# Patient Record
Sex: Male | Born: 1960 | Race: White | Hispanic: No | Marital: Married | Smoking: Never smoker
Health system: Southern US, Academic
[De-identification: ages and names within clinical notes are randomized; demographics above are authoritative.]

## PROBLEM LIST (undated history)

## (undated) DIAGNOSIS — K5730 Diverticulosis of large intestine without perforation or abscess without bleeding: Secondary | ICD-10-CM

## (undated) HISTORY — PX: HX TONSILLECTOMY: SHX27

## (undated) HISTORY — DX: Diverticulosis of large intestine without perforation or abscess without bleeding: K57.30

## (undated) HISTORY — PX: HX HEMORRHOIDECTOMY: SHX153

## (undated) HISTORY — PX: HX COLON SURGERY (ANY): 2100001403

---

## 1998-09-10 ENCOUNTER — Ambulatory Visit (HOSPITAL_COMMUNITY): Payer: Self-pay | Admitting: Orthopaedic Surgery

## 2016-12-29 ENCOUNTER — Ambulatory Visit (HOSPITAL_COMMUNITY): Payer: Self-pay | Admitting: PSYCHIATRY AND NEUROLOGY-NEUROLOGY

## 2022-01-04 ENCOUNTER — Other Ambulatory Visit (INDEPENDENT_AMBULATORY_CARE_PROVIDER_SITE_OTHER): Payer: BC Managed Care – PPO | Admitting: OTOLARYNGOLOGY

## 2022-01-04 DIAGNOSIS — J301 Allergic rhinitis due to pollen: Secondary | ICD-10-CM

## 2022-01-04 DIAGNOSIS — J3089 Other allergic rhinitis: Secondary | ICD-10-CM

## 2022-01-04 DIAGNOSIS — J3081 Allergic rhinitis due to animal (cat) (dog) hair and dander: Secondary | ICD-10-CM

## 2022-01-04 NOTE — Nursing Note (Signed)
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)   [place] Grass   Initial Endpoint - [place] Grass 5   Dilution of Antigen 1   Volume of Antigen 0.28ml   Timothy   Initial Endpoint - Timothy 6   Dilution of Antigen 2   Volume of Antigen 0.51ml   Bahia   Initial Endpoint - Bahia 5   Dilution of Antigen 1   Volume of Antigen 0.72ml   Ragweed   Initial Endpoint - Ragweed 6   Dilution of Antigen 2   Volume of Antigen 0.52ml   Mugwort   Initial Endpoint - Mugwort 5   Dilution of Antigen 1   Volume of Antigen 0.69ml   A   Concentrates A 1   10% Glycerine Diluent A 4.5   Total Volume A 5.5   B   Preparation Date 01/04/22   Expiration Date 04/19/22   Immunotherapy Status No escalation   Diagnosis 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);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);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)   Candida   Initial Endpoint - Candida 4   Dilution of Antigen 1   Volume of Antigen 0.43ml   Epidermophyton   Initial Endpoint - Epidermophyton 4   Dilution of Antigen 1   Volume of Antigen 0.17ml   Bipolaris Sorokiniana   Initial Endpoint - Bipolaris Sorokiniana 3   Dilution of Antigen Concentrate   Volume of Antigen 0.20ml   Penicillium   Initial Endpoint - Penicillium 2   Dilution of Antigen Concentrate   Volume of Antigen 0.61ml   Corn Smut   Initial Endpoint - Corn Smut 2   Dilution of Antigen Concentrate   Volume of Antigen 0.37ml   Cat   Initial Endpoint - Cat 2   Dilution of Antigen Concentrate   Volume of Antigen 0.21ml   D. Farinae Dust Mite   Initial Endpoint - D. Farinae Dust Mite 4   Dilution of Antigen Concentrate   Volume of Antigen 0.107ml   D. Pteronyssinus   Initial Endpoint - D. Pteronyssinus 4   Dilution of Antigen Concentrate   Volume of Antigen 0.10ml   B    Concentrates B 1.6   Saline Diluent B 3.9   Total Volume B 5.5     Levert Feinstein, LPN

## 2022-01-18 ENCOUNTER — Ambulatory Visit (INDEPENDENT_AMBULATORY_CARE_PROVIDER_SITE_OTHER): Payer: Self-pay

## 2022-01-18 ENCOUNTER — Ambulatory Visit (INDEPENDENT_AMBULATORY_CARE_PROVIDER_SITE_OTHER): Payer: BC Managed Care – PPO

## 2022-01-18 ENCOUNTER — Other Ambulatory Visit: Payer: Self-pay

## 2022-01-18 DIAGNOSIS — J309 Allergic rhinitis, unspecified: Secondary | ICD-10-CM

## 2022-01-18 NOTE — Nursing Note (Signed)
01/18/22 1300   Vial A   Set 2   Skin test/Wheal Size 0.68ml, 80mm   Injection Laterality Right   Injection Site Upper Arm   Dose  0.05   Time Observed 30 Minutes   Injection Site Reaction Negative   Vial B   Set 1   Skin test/Wheal Size 0.75ml, 71mm   Injection Laterality Left   Injection Site Upper Arm   Dose  0.05   Time Observed 30 Minutes   Injection Site Reaction Negative     Gabriel Carina, LPN  [id number], [id number]    I have reviewed the above allergy vial safety test, give injection.  Emiliano Dyer, FNP-BC

## 2022-04-07 IMAGING — MR MRI BRAIN W/O CONTRAST
8 of 10 series · 34 of 48 positions shown · IV contrast (gadolinium)
Comparison: Brain MRI dated 03/16/2021 and 03/23/2020.

﻿EXAM:  MRI BRAIN W/O CONTRAST
INDICATION: History of multiple sclerosis, follow-up exam.
TECHNIQUE: Multiplanar, multisequential MRI of the brain was performed without gadolinium contrast.

[Series 5: DWI · axial · 5.0mm · 1.35mm/px · z∈[-69,+57]mm · 11 of 88 slices shown (1 of 3)]
[im 1/88]
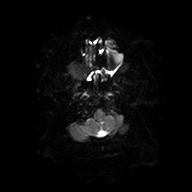
[im 9/88]
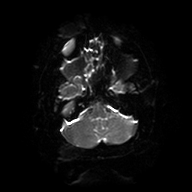
[im 18/88]
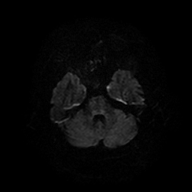
[im 27/88]
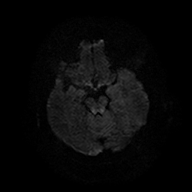
[im 35/88]
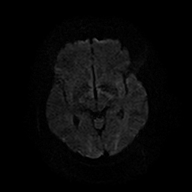
[im 44/88]
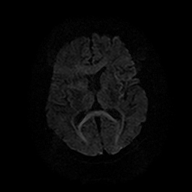
[im 53/88]
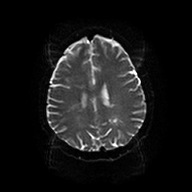
[im 61/88]
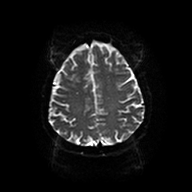
[im 70/88]
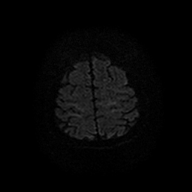
[im 79/88]
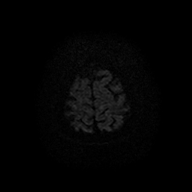
[im 88/88]
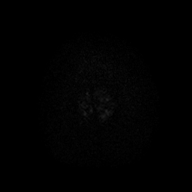

[Series 6: DWI · axial · 5.0mm · 1.35mm/px · z∈[-69,+57]mm · 2 of 22 slices shown (2 of 3)]
[im 1/22]
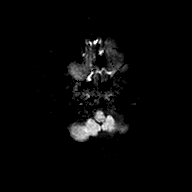
[im 22/22]
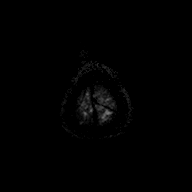

[Series 7: DWI · axial · 5.0mm · 1.35mm/px · z∈[-69,+57]mm · 2 of 18 slices shown (3 of 3)]
[im 1/18]
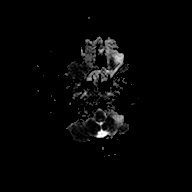
[im 18/18]
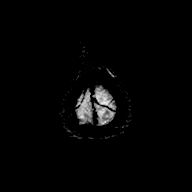

[Series 8: FLAIR · sagittal · 5.0mm · 0.75mm/px · 4 of 28 slices shown (1 of 2)]
[im 1/28]
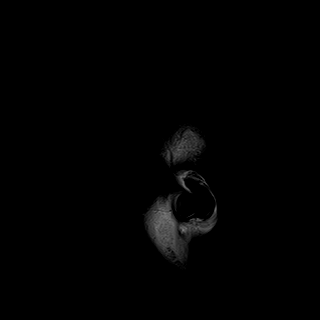
[im 10/28]
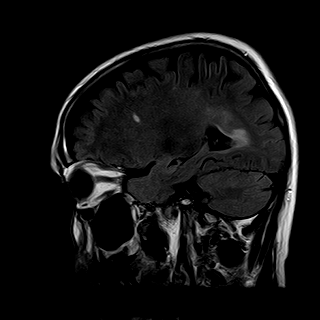
[im 19/28]
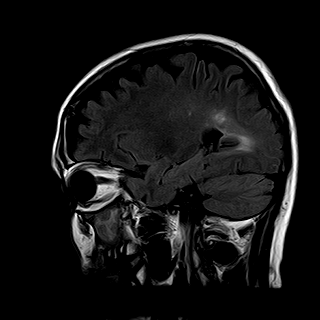
[im 28/28]
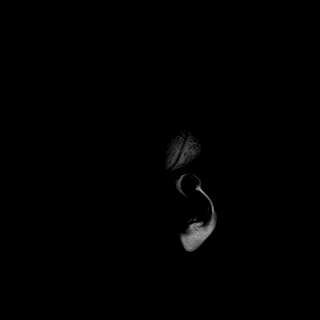

[Series 9: PD · axial · 4.0mm · 0.43mm/px · 1 of 36 slices shown]
[im 1/36]
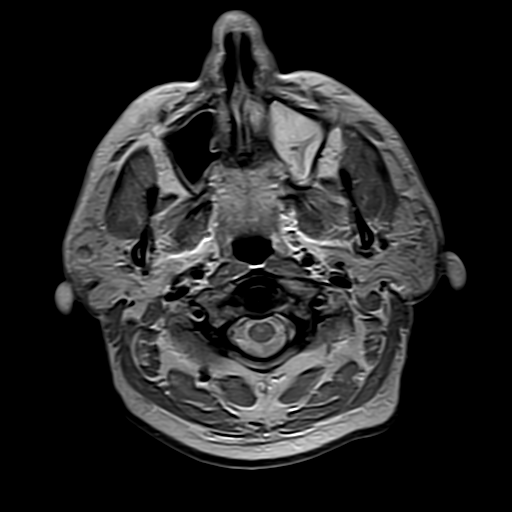

[Series 11: FLAIR · axial · 4.0mm · 0.43mm/px · z∈[-92,+62]mm · 5 of 36 slices shown (2 of 2)]
[im 1/36]
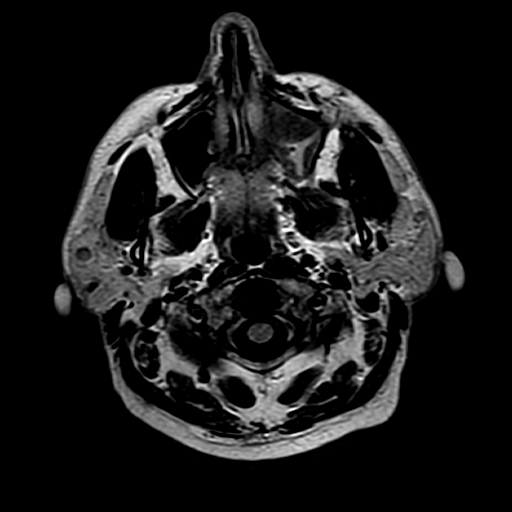
[im 9/36]
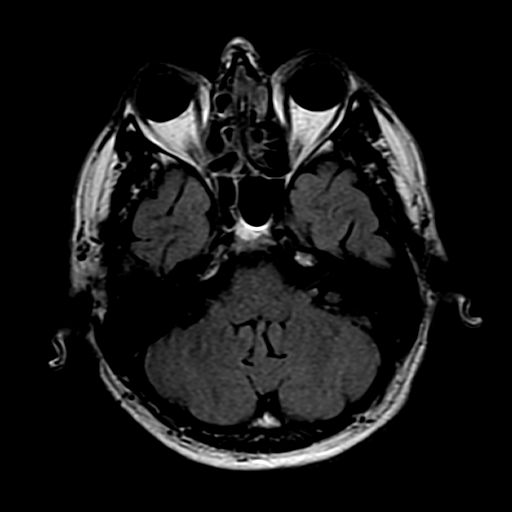
[im 18/36]
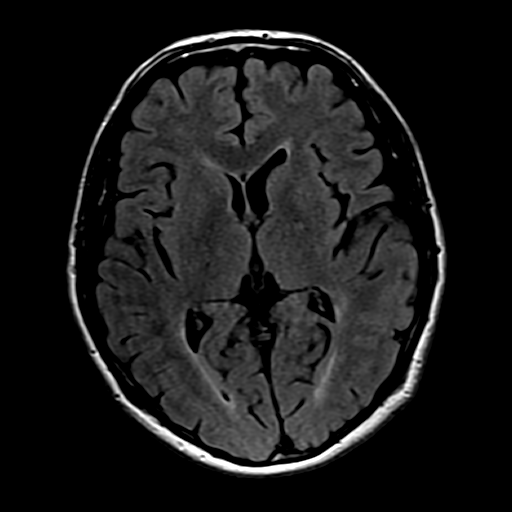
[im 27/36]
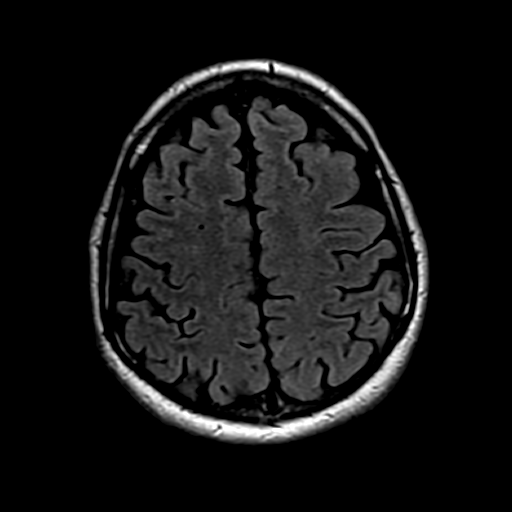
[im 36/36]
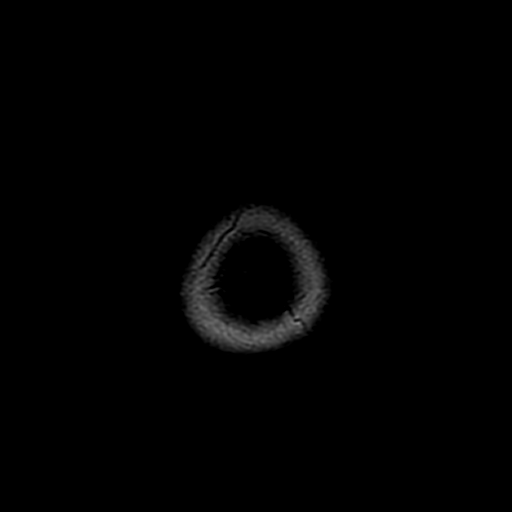

[Series 13: T1 · axial · 4.0mm · 0.43mm/px · z∈[-92,+62]mm · 5 of 36 slices shown]
[im 1/36]
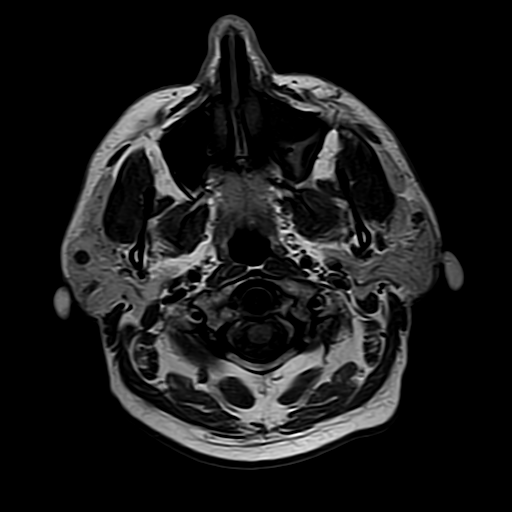
[im 9/36]
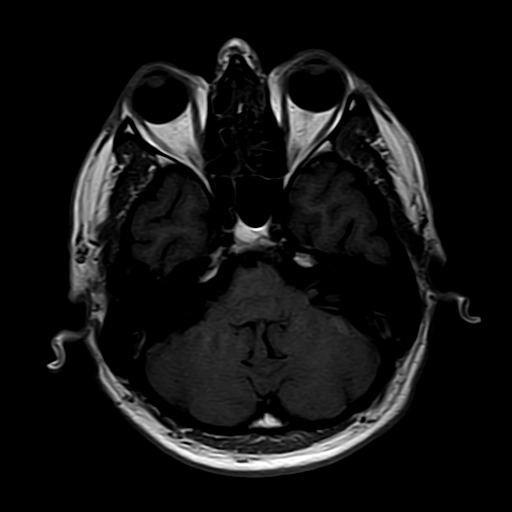
[im 18/36]
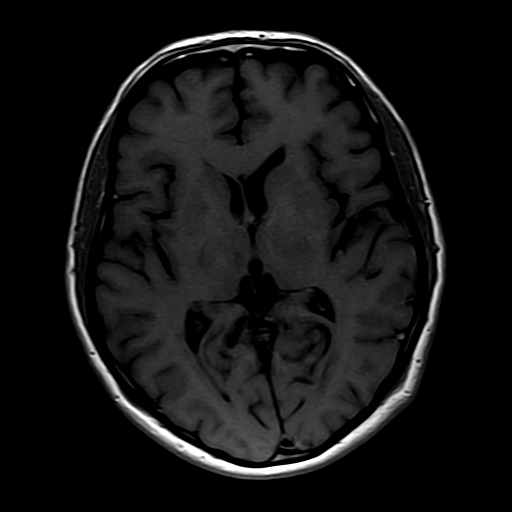
[im 27/36]
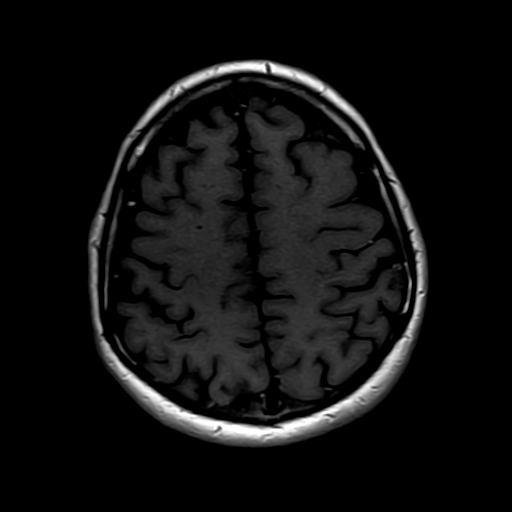
[im 36/36]
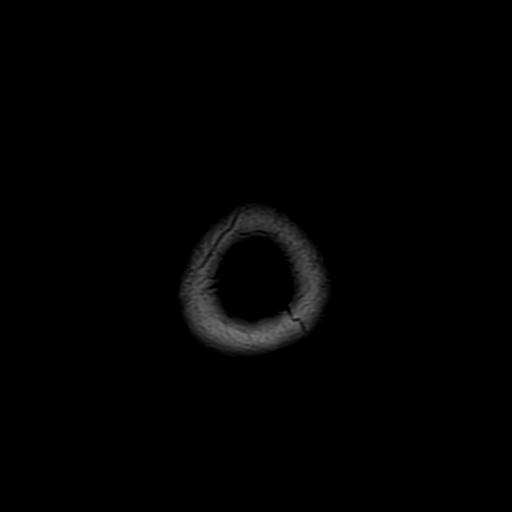

[Series 14: T2 · coronal · 5.2mm · 0.43mm/px · 4 of 30 slices shown]
[im 1/30]
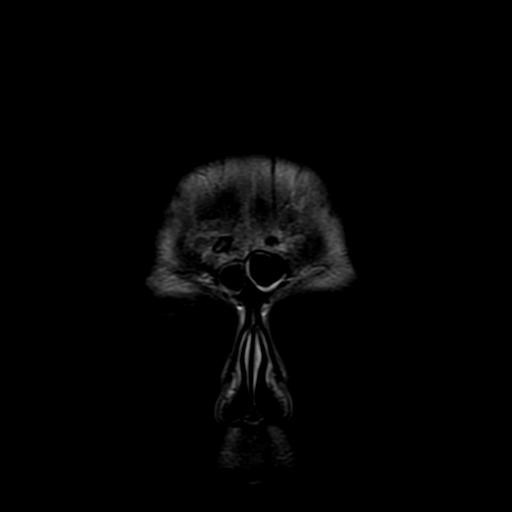
[im 10/30]
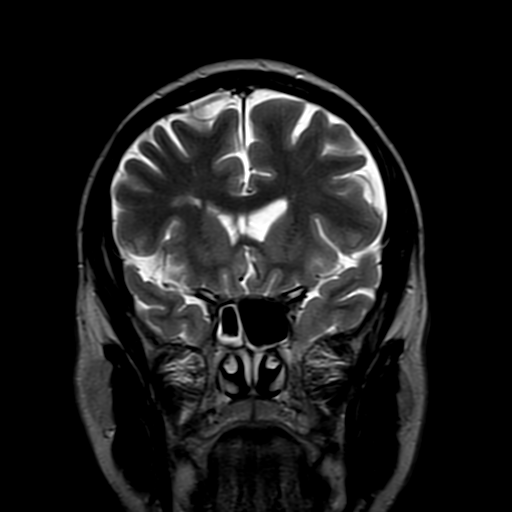
[im 20/30]
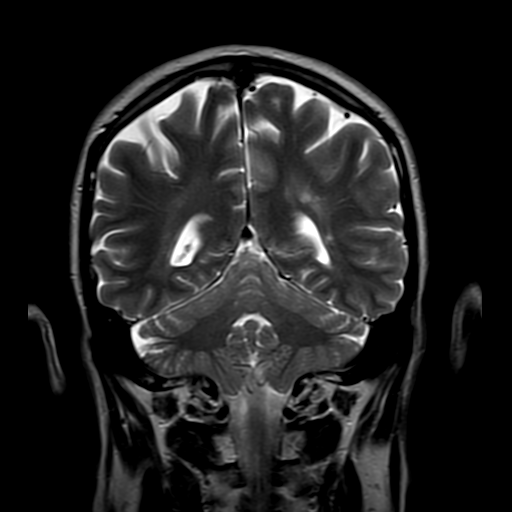
[im 30/30]
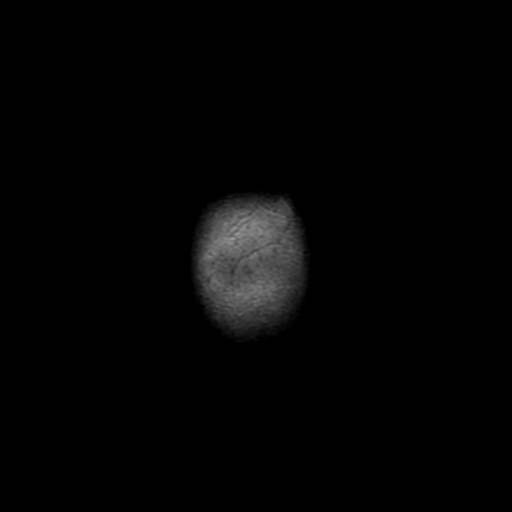

[34 of 48 positions shown; findings below may reference images not displayed]

FINDINGS: Ventricular and sulcal size is normal for the patient's age.  Approximately 4-5 foci of abnormal T2/FLAIR signal hyperintensity within each cerebral hemisphere are again identified. There are no new lesions.  There is no mass effect, midline shift or intracranial hemorrhage. There is no evidence of acute infarction or prior microhemorrhages. Skull base flow voids and basal cisterns are patent. Sagittal survey of midline structures is unremarkable. There are no extra-axial fluid collections.  There is complete opacification of the left maxillary sinus. Significant mucoperiosteal thickening of the remaining paranasal sinuses is also noted. Mastoid air cells and orbital contents are unremarkable.
IMPRESSION: Stable disease.  No acute intracranial abnormality.

## 2022-04-10 ENCOUNTER — Other Ambulatory Visit: Payer: Self-pay

## 2022-05-03 ENCOUNTER — Other Ambulatory Visit (INDEPENDENT_AMBULATORY_CARE_PROVIDER_SITE_OTHER): Payer: BC Managed Care – PPO | Admitting: OTOLARYNGOLOGY

## 2022-05-03 DIAGNOSIS — J301 Allergic rhinitis due to pollen: Secondary | ICD-10-CM

## 2022-05-03 DIAGNOSIS — J3089 Other allergic rhinitis: Secondary | ICD-10-CM

## 2022-05-03 NOTE — Nursing Note (Signed)
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)   [place] Grass   Initial Endpoint - [place] Grass 5   Dilution of Antigen 1   Volume of Antigen 0.61ml   Timothy   Initial Endpoint - Timothy 6   Dilution of Antigen 2   Volume of Antigen 0.54ml   Bahia   Initial Endpoint - Bahia 5   Dilution of Antigen 1   Volume of Antigen 0.40ml   Ragweed   Initial Endpoint - Ragweed 6   Dilution of Antigen 2   Volume of Antigen 0.85ml   Mugwort   Initial Endpoint - Mugwort 5   Dilution of Antigen 1   Volume of Antigen 0.27ml   A   Concentrates A 1   10% Glycerine Diluent A 4.5   Total Volume A 5.5   B   Preparation Date 05/03/22   Expiration Date 08/17/22   Immunotherapy Status No escalation   Diagnosis 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);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);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)   Candida   Initial Endpoint - Candida 4   Dilution of Antigen 1   Volume of Antigen 0.62ml   Epidermophyton   Initial Endpoint - Epidermophyton 4   Dilution of Antigen 1   Volume of Antigen 0.81ml   Bipolaris Sorokiniana   Initial Endpoint - Bipolaris [place] 3   Dilution of Antigen Concentrate   Volume of Antigen 0.50ml   Penicillium   Initial Endpoint - Penicillium 2   Dilution of Antigen Concentrate   Volume of Antigen 0.41ml   Corn Smut   Initial Endpoint - Corn Smut 2   Dilution of Antigen Concentrate   Volume of Antigen 0.60ml   Cat   Initial Endpoint - Cat 2   Dilution of Antigen Concentrate   Volume of Antigen 0.84ml   D. Farinae Dust Mite   Initial Endpoint - D. Farinae Dust Mite 4   Dilution of Antigen Concentrate   Volume of Antigen 0.86ml   D. Pteronyssinus   Initial Endpoint - D. Pteronyssinus 4   Dilution of Antigen Concentrate   Volume of Antigen 0.62ml   B    Concentrates B 1.6   Saline Diluent B 3.9   Total Volume B 5.5     Jasmine Awe, LPN  I have reviewed the above allergy test results which were positive, and will pursue treatment accordingly.  Lonia Farber, DO

## 2022-05-17 ENCOUNTER — Ambulatory Visit (INDEPENDENT_AMBULATORY_CARE_PROVIDER_SITE_OTHER): Payer: BC Managed Care – PPO

## 2022-05-17 ENCOUNTER — Other Ambulatory Visit: Payer: Self-pay

## 2022-05-17 DIAGNOSIS — J309 Allergic rhinitis, unspecified: Secondary | ICD-10-CM

## 2022-05-17 NOTE — Nursing Note (Signed)
05/17/22 1300   Vial A   Set 2   Skin test/Wheal Size 0.31ml/ 57mm   Injection Laterality Right   Injection Site Upper Arm   Dose  0.25   Time Observed 30 Minutes   Vial B   Set 1   Skin test/Wheal Size 0.70ml/ 63mm   Injection Laterality Left   Injection Site Upper Arm   Dose  0.25   Time Observed 30 Minutes     Merlinda Frederick, LPN  I have reviewed the above allergy vial safety test, give injection.  Elnora Morrison, FNP-BC

## 2022-05-17 NOTE — Nursing Note (Signed)
05/17/22 1300   Vial A   Set 2   Skin test/Wheal Size 0.66ml/ 23mm   Injection Laterality Right   Injection Site Upper Arm   Dose  0.25   Time Observed 30 Minutes   Injection Site Reaction Negative   Vial B   Set 1   Skin test/Wheal Size 0.13ml/ 62mm   Injection Laterality Left   Injection Site Upper Arm   Dose  0.25   Time Observed 30 Minutes   Injection Site Reaction Negative     Merlinda Frederick, LPN

## 2022-05-26 ENCOUNTER — Encounter (INDEPENDENT_AMBULATORY_CARE_PROVIDER_SITE_OTHER): Payer: Self-pay | Admitting: Surgery

## 2022-05-30 ENCOUNTER — Ambulatory Visit (INDEPENDENT_AMBULATORY_CARE_PROVIDER_SITE_OTHER): Payer: BC Managed Care – PPO | Admitting: Surgery

## 2022-05-30 ENCOUNTER — Other Ambulatory Visit: Payer: Self-pay

## 2022-05-30 ENCOUNTER — Encounter (INDEPENDENT_AMBULATORY_CARE_PROVIDER_SITE_OTHER): Payer: Self-pay | Admitting: Surgery

## 2022-05-30 VITALS — BP 140/75 | HR 62 | Temp 96.8°F | Resp 18 | Ht 66.0 in | Wt 164.0 lb

## 2022-05-30 DIAGNOSIS — Z1211 Encounter for screening for malignant neoplasm of colon: Secondary | ICD-10-CM

## 2022-05-30 MED ORDER — SUTAB 1.479-0.188-0.225 GRAM TABLET
ORAL_TABLET | ORAL | 0 refills | Status: DC
Start: 2022-05-30 — End: 2022-09-13

## 2022-06-01 NOTE — Progress Notes (Signed)
GENERAL SURGERY, [facility]  [address]  [place] [id number]    History and Physical     Name: Clarence Nguyen MRN:  [id number]   Date: 05/30/2022 Age: 61 y.o.            Reason for Visit: Colonoscopy    History of Present Illness  [name] presents today for screening colonoscopy    Negative diabetes, blood thinner   Positive family history colon cancer in grandmother      Review of the result(s) of each unique test:  Patient underwent diagnostic testing ( none ) prior to this dates visit.  I have personally reviewed the results and that serves as a component of the medical decision making for this encounter       Review of prior external note(s) from each unique source:  Patients referral to this office including a recent assessment by the referring provider.  This was reviewed by me for this unique office visit for the indication and intent of the referral as well as any pertinent medical or surgical history relevant to the patients independent evaluation by me today.      Patient History  Past Medical History:   Diagnosis Date    Diverticulosis of colon          Past Surgical History:   Procedure Laterality Date    HX COLON SURGERY (ANY)      HX HEMORRHOIDECTOMY      HX TONSILLECTOMY           Current Outpatient Medications   Medication Sig    atorvastatin (LIPITOR) 10 mg Oral Tablet     famotidine (PEPCID) 40 mg Oral Tablet     fluticasone propionate (XHANCE) 93 mcg/actuation Nasal Aerosol Breath Activated Spray 1 spray twice a day by intranasal route.    glatiramer (COPAXONE) 40 mg/mL Subcutaneous Syringe Inject 1 mL (40 mg total) under the skin    sod sulf-pot chloride-mag sulf (SUTAB) 1.479-0.188- 0.225 gram Oral Tablet Take 12 pills at 10:00am to 10:30am Take 12 pills at 6:00pm to 6:30pm. Drink plenty of water     No Known Allergies  Family Medical History:    None         Social History     Tobacco Use    Smoking status: Never    Smokeless tobacco: Never   Vaping  Use    Vaping Use: Never used   Substance Use Topics    Alcohol use: Not Currently    Drug use: Not Currently            Physical Examination:  Vitals:    05/30/22 1452   BP: (!) 140/75   Pulse: 62   Resp: 18   Temp: 36 C (96.8 F)   SpO2: 97%   Weight: 74.4 kg (164 lb)   Height: 1.676 m (5\' 6" )   BMI: 26.53        General: appropriate for age. in no acute distress.    Vital signs are present above and have been reviewed by me     HEENT: Atraumatic, Normocephalic. PERRLA. EOMI. Nose clear. Throat clear    Lungs: Nonlabored breathing with symmetric expansion. Clear to auscultation bilaterally    Heart:Regular wth respect to rate and rythmn.    Abdomen:Soft. Nontender. Nondistended and benign    Extremities: Grossly normal. No major deformities     Neuro:  Grossly normal motor and sensory function    Psychiatric: Alert  and oriented to person, place, and time. affect appropriate      Assessment and Plan  Screening colonoscopy scheduled for 09/13/2022 at 8:00 a.m.      Follow Up:  No follow-ups on file.      ICD-10-CM    1. Encounter for screening colonoscopy  Z12.11           Clarence Sandra B Warnie Belair, MD ,MBA,FACS    I appreciate the opportunity to be involved in the care of your patients.  If you have any questions or concerns regarding this encounter, please do not hesitate to contact me at your convenience.      This note may have been partially generated using MModal Fluency Direct system, and there may be some incorrect words, spellings, and punctuation that were not noted in checking the note before saving, though effort was made to avoid such errors.

## 2022-08-09 ENCOUNTER — Encounter (INDEPENDENT_AMBULATORY_CARE_PROVIDER_SITE_OTHER): Payer: Self-pay | Admitting: OTOLARYNGOLOGY

## 2022-09-06 ENCOUNTER — Other Ambulatory Visit (INDEPENDENT_AMBULATORY_CARE_PROVIDER_SITE_OTHER): Payer: BC Managed Care – PPO | Admitting: OTOLARYNGOLOGY

## 2022-09-06 DIAGNOSIS — J3089 Other allergic rhinitis: Secondary | ICD-10-CM

## 2022-09-06 DIAGNOSIS — J301 Allergic rhinitis due to pollen: Secondary | ICD-10-CM

## 2022-09-06 NOTE — Nursing Note (Signed)
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)   [place] Grass   Initial Endpoint - [place] Grass 5   Dilution of Antigen 1   Volume of Antigen 0.60ml   Timothy   Initial Endpoint - Timothy 6   Dilution of Antigen 2   Volume of Antigen 0.58ml   Bahia   Initial Endpoint - Bahia 5   Dilution of Antigen 1   Volume of Antigen 0.8ml   Ragweed   Initial Endpoint - Ragweed 6   Dilution of Antigen 2   Volume of Antigen 0.61ml   Mugwort   Initial Endpoint - Mugwort 5   Dilution of Antigen 1   Volume of Antigen 0.21ml   A   Concentrates A 1   10% Glycerine Diluent A 4.5   Total Volume A 5.5   B   Preparation Date 09/06/22   Expiration Date 12/20/22   Immunotherapy Status No escalation   Diagnosis 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);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);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)   Candida   Initial Endpoint - Candida 4   Dilution of Antigen 1   Volume of Antigen 0.48ml   Epidermophyton   Initial Endpoint - Epidermophyton 4   Dilution of Antigen 1   Volume of Antigen 0.86ml   Bipolaris Sorokiniana   Initial Endpoint - Bipolaris [place]iana 3   Dilution of Antigen Concentrate   Volume of Antigen 0.61ml   Penicillium   Initial Endpoint - Penicillium 2   Dilution of Antigen Concentrate   Volume of Antigen 0.81ml   Corn Smut   Initial Endpoint - Corn Smut 2   Dilution of Antigen Concentrate   Volume of Antigen 0.58ml   Cat   Initial Endpoint - Cat 2   Dilution of Antigen Concentrate   Volume of Antigen 0.94ml   D. Farinae Dust Mite   Initial Endpoint - D. Farinae Dust Mite 4   Dilution of Antigen Concentrate   Volume of Antigen 0.41ml   D. Pteronyssinus   Initial Endpoint - D. Pteronyssinus 4   Dilution of Antigen Concentrate   Volume of Antigen 0.26ml   B    Concentrates B 1.6   Saline Diluent B 3.9   Total Volume B 5.5     Jasmine Awe, LPN

## 2022-09-13 ENCOUNTER — Encounter (HOSPITAL_COMMUNITY)
Admission: RE | Disposition: A | Discharge status: Home / Self Care | Payer: Self-pay | Source: Ambulatory Visit | Attending: Surgery

## 2022-09-13 ENCOUNTER — Ambulatory Visit (HOSPITAL_COMMUNITY): Payer: BC Managed Care – PPO | Admitting: Certified Registered"

## 2022-09-13 ENCOUNTER — Inpatient Hospital Stay
Admission: RE | Admit: 2022-09-13 | Discharge: 2022-09-13 | Disposition: A | Discharge status: Home / Self Care | Payer: BC Managed Care – PPO | Source: Ambulatory Visit | Attending: Surgery | Admitting: Surgery

## 2022-09-13 ENCOUNTER — Other Ambulatory Visit: Payer: Self-pay

## 2022-09-13 ENCOUNTER — Encounter (HOSPITAL_COMMUNITY): Payer: BC Managed Care – PPO | Admitting: Surgery

## 2022-09-13 DIAGNOSIS — K5730 Diverticulosis of large intestine without perforation or abscess without bleeding: Secondary | ICD-10-CM | POA: Insufficient documentation

## 2022-09-13 DIAGNOSIS — Z980 Intestinal bypass and anastomosis status: Secondary | ICD-10-CM | POA: Insufficient documentation

## 2022-09-13 DIAGNOSIS — G4733 Obstructive sleep apnea (adult) (pediatric): Secondary | ICD-10-CM | POA: Insufficient documentation

## 2022-09-13 DIAGNOSIS — E785 Hyperlipidemia, unspecified: Secondary | ICD-10-CM | POA: Insufficient documentation

## 2022-09-13 DIAGNOSIS — Z1211 Encounter for screening for malignant neoplasm of colon: Secondary | ICD-10-CM | POA: Insufficient documentation

## 2022-09-13 DIAGNOSIS — Z800 Family history of malignant neoplasm of digestive organs: Secondary | ICD-10-CM | POA: Insufficient documentation

## 2022-09-13 SURGERY — COLONOSCOPY
Anesthesia: General | Wound class: Clean Contaminated Wounds-The respiratory, GI, Genital, or urinary

## 2022-09-13 MED ORDER — PROPOFOL 10 MG/ML IV BOLUS
INJECTION | Freq: Once | INTRAVENOUS | Status: DC | PRN
Start: 2022-09-13 — End: 2022-09-13
  Administered 2022-09-13: 100 mg via INTRAVENOUS
  Administered 2022-09-13: 50 mg via INTRAVENOUS

## 2022-09-13 MED ORDER — DEXTROSE 5 % AND LACTATED RINGERS INTRAVENOUS SOLUTION
INTRAVENOUS | Status: DC
Start: 2022-09-13 — End: 2022-09-13
  Administered 2022-09-13: 0 via INTRAVENOUS

## 2022-09-13 MED ORDER — LIDOCAINE (PF) 100 MG/5 ML (2 %) INTRAVENOUS SYRINGE
INJECTION | Freq: Once | INTRAVENOUS | Status: DC | PRN
Start: 2022-09-13 — End: 2022-09-13
  Administered 2022-09-13: 100 mg via INTRAVENOUS

## 2022-09-13 NOTE — Anesthesia Postprocedure Evaluation (Signed)
Anesthesia Post Op Evaluation    Patient: Clarence Nguyen  Procedure(s):  COLONOSCOPY    Last Vitals:Temperature: 36.3 C (97.3 F) (09/13/22 0739)  Heart Rate: 66 (09/13/22 0739)  BP (Non-Invasive): (!) 140/72 (09/13/22 0739)  Respiratory Rate: 20 (09/13/22 0739)  SpO2: 98 % (09/13/22 0739)    No notable events documented.    Patient is sufficiently recovered from the effects of anesthesia to participate in the evaluation and has returned to their pre-procedure level.  Patient location during evaluation: PACU       Patient participation: complete - patient participated  Level of consciousness: awake and alert and responsive to verbal stimuli    Pain management: adequate  Airway patency: patent    Anesthetic complications: no  Cardiovascular status: acceptable  Respiratory status: acceptable  Hydration status: acceptable  Patient post-procedure temperature: Pt Normothermic   PONV Status: Absent

## 2022-09-13 NOTE — OR Surgeon (Signed)
[facility]      Patient Name: Clarence Nguyen, Clarence Nguyen Number: [id number]  Date of Service: 09/13/2022   Date of Birth: 1961-01-09      Pre-Operative Diagnosis: SCREENING     Post-Operative Diagnosis: Ascending Colon Diverticulosis  Scattered Diverticulosis Throughout  Normal Anastamosis    Procedure(s)/Description:  COLONOSCOPY: 45409 (CPT)     Attending Surgeon: Fidela Juneau, MD     Anesthesia:  CRNA: Magdalene Patricia, CRNA    Anesthesia Type: .General       The patient indicates that they have read and understood the preoperative colonoscopy consent form. The benefits, risks and alternatives to the procedure were discussed. I specifically discussed the risk of bleeding and/or perforation requiring operation.The patient indicates they have no further question and wish to proceed. Informed consent was obtained from the patient and/or medical power of attorney.    The patient was brought into the procedure room and placed on the table in the left lateral decubitus position. After IV sedation was given, full finger digital rectal examination was performed with a circumferential sweep of the distal rectal mucosa. Subsequently, the flexible colonoscope was inserted into the rectum and passed without any difficulty. The colonoscope was then advanced up into the area of distal anastomosis in the left lower quadrant, descending colon, transverse colon, right colon and cecum without any difficulty. Gross examination of each section of the colon was performed. Cecal intubation was achieved and the appendiceal orifice and ileocecal valve were identified. The operative findings of diverticulosis were noted as described above. The colonoscope was withdrawn carefully examining the mucosa as the scope was being extracted with particular attention paid to the proximal sides of folds, flexures, bends and rectal valves. At approximately 10 cm. from the anal verge, the colonoscope was retroflexed to fully  examine the distal rectum. The colonoscope was removed and a repeat digital rectal examination was performed at the completion of the procedure. The patient tolerated the procedure well. No intraoperative complications were encountered.    EKG, pulse, pulse oximetry and blood pressure were monitored throughout the entire procedure.    There were no unplanned events.    The patient was instructed to contact me if they have any problems with their colon such as bleeding, pain or changes in bowel habits. They understood and agreed to do so.    The patient will not need another screening colonoscopy for 10 years which is according to ASGE guidelines. However, if in the future the patient has any problems with abdominal pain, changes in bowel habits, blood in stool, etc., then they should contact me because they may be a candidate for diagnostic colonoscopy before the 10 year time limit.      Mckynlie Vanderslice B Syrita Dovel, MD,MBA,FACS

## 2022-09-13 NOTE — H&P (Signed)
[facility]  General Surgery  History and Physical    Date of Service:  09/13/2022  Clarence Nguyen, Clarence Nguyen, 61 y.o. male  Date of Admission:  09/13/2022  Date of Birth:  [birth year]  PCP: No Pcp    Reason for admission:  Colonoscopy    HPI:  Clarence Nguyen is a 61 y.o. White male who is admitted for SCREENING     DrAvel Sensor presents today for screening colonoscopy     Negative diabetes, blood thinner   Positive family history colon cancer in grandmother        Review of the result(s) of each unique test:  Patient underwent diagnostic testing ( none ) prior to this dates visit.  I have personally reviewed the results and that serves as a component of the medical decision making for this encounter        Review of prior external note(s) from each unique source:  Patients referral to this office including a recent assessment by the referring provider.  This was reviewed by me for this unique office visit for the indication and intent of the referral as well as any pertinent medical or surgical history relevant to the patients independent evaluation by me today.    Past Medical History:   Diagnosis Date    Diverticulosis of colon       Past Surgical History:   Procedure Laterality Date    HX COLON SURGERY (ANY)      HX HEMORRHOIDECTOMY      HX TONSILLECTOMY        Social History     Tobacco Use    Smoking status: Never    Smokeless tobacco: Never   Vaping Use    Vaping Use: Never used   Substance Use Topics    Alcohol use: Not Currently    Drug use: Not Currently       Family Medical History:    None        Medications Prior to Admission       Prescriptions    atorvastatin (LIPITOR) 10 mg Oral Tablet    famotidine (PEPCID) 40 mg Oral Tablet    fluticasone propionate (XHANCE) 93 mcg/actuation Nasal Aerosol Breath Activated    Spray 1 spray twice a day by intranasal route.    glatiramer (COPAXONE) 40 mg/mL Subcutaneous Syringe    Inject 1 mL (40 mg total) under the skin    sod sulf-pot chloride-mag  sulf (SUTAB) 1.479-0.188- 0.225 gram Oral Tablet    Take 12 pills at 10:00am to 10:30am Take 12 pills at 6:00pm to 6:30pm. Drink plenty of water           No Known Allergies       No data found.       General: appropriate for age. in no acute distress.    Vital signs are present above and have been reviewed by me     HEENT: Atraumatic, Normocephalic. PERRLA, EOMI. Nose clear. Throat clear.    Lungs: Nonlabored breathing with symmetric expansion.  Clear to auscultation bilaterally    Heart:Regular wth respect to rate and rythmn.    Abdomen:Soft. Nontender. Nondistended and benign    Extremities:  Grossly normal with good range of motion and no major deformities.    Neuro:  Grossly normal motor and sensory function. CN's II through XII intact.    Psychiatric: Alert and oriented to person, place, and time. affect appropriate    Laboratory Data:     No  results found for any visits on 09/13/22 (from the past 24 hour(s)).    Imaging Studies:    No orders to display        Assessment/Plan:  SCREENING    Colonoscopy scheduled for Wednesday September 13, 2022    This note was partially created using voice recognition software and is inherently subject to errors including those of syntax and "sound alike " substitutions which may escape proof reading. In such instances, original meaning may be extrapolated by contextual derivation.    Clarence Ora, MD, MBA, FACS

## 2022-09-13 NOTE — Anesthesia Preprocedure Evaluation (Signed)
ANESTHESIA PRE-OP EVALUATION  Planned Procedure: COLONOSCOPY  Review of Systems     anesthesia history negative     patient summary reviewed  nursing notes reviewed        Pulmonary   sleep apnea and CPAP,   Cardiovascular  negative cardio ROS,   ECG reviewed and hyperlipidemia , Exercise Tolerance: > or = 4 METS        GI/Hepatic/Renal   negative GI/hepatic/renal ROS,         Endo/Other   neg endo/other ROS,       Neuro/Psych/MS   negative neuro/psych ROS,      Cancer    negative hematology/oncology ROS,               Physical Assessment      Airway       Mallampati: I    TM distance: <3 FB    Mouth Opening: good.            Dental       Dentition intact             Pulmonary    Breath sounds clear to auscultation       Cardiovascular    Rhythm: regular  Rate: Normal       Other findings          Plan  ASA 2     Planned anesthesia type: general     general intravenous        SLEEP APNEA  Patient is at risk of obstructive sleep apnea and Education provided regarding risk of obstructive sleep apnea          Intravenous induction       Anesthetic plan and risks discussed with patient  signed consent obtained            Patient's NPO status is appropriate for Anesthesia.

## 2022-09-13 NOTE — Discharge Instructions (Addendum)
SURGICAL DISCHARGE INSTRUCTIONS     Dr. Donnal Debar, Gene B, MD  performed your COLONOSCOPY today at the [facility]  Findings: Ascending Colon Diverticulosis, Scattered Diverticulosis Throughout, Normal Anastamosis     [facility]:  Monday through Friday from 8 a.m. - 4 p.m.: [phone]    For T&D: [phone]  Between 4 p.m. - 8 a.m., weekends and holidays:  Call ER [phone]    PLEASE SEE WRITTEN HANDOUTS AS DISCUSSED BY YOUR NURSE:  Nadja Lina, RN    ANESTHESIA INFORMATION   ANESTHESIA -- ADULT PATIENTS:  You have received intravenous sedation / general anesthesia, and you may feel drowsy and light-headed for several hours. You may even experience some forgetfulness of the procedure. DO NOT DRIVE A MOTOR VEHICLE or perform any activity requiring complete alertness or coordination until you feel fully awake in about 24-48 hours. Do not drink alcoholic beverages for at least 24 hours. Do not stay alone, you must have a responsible adult available to be with you. You may also experience a dry mouth or nausea for 24 hours. This is a normal side effect and will disappear as the effects of the medication wear off.    REMEMBER   If you experience any difficulty breathing, chest pain, bleeding that you feel is excessive, persistent nausea or vomiting or for any other concerns:  Call your physician Dr. Donnal Debar, Cherylann Parr, MD at [phone] . You may also ask to have the general doctor on call paged. They are available to you 24 hours a day.      SPECIAL INSTRUCTIONS / COMMENTS   Drink plenty of water and eat a high-fiber diet to help with Diverticulosis.    FOLLOW-UP APPOINTMENTS   Follow-up with Dr. Donnal Debar if you have any new or worsening symptoms.  Repeat Colonoscopy in 10 years.

## 2022-09-14 ENCOUNTER — Telehealth (INDEPENDENT_AMBULATORY_CARE_PROVIDER_SITE_OTHER): Payer: Self-pay | Admitting: Surgery

## 2022-09-20 ENCOUNTER — Other Ambulatory Visit: Payer: Self-pay

## 2022-09-20 ENCOUNTER — Ambulatory Visit (INDEPENDENT_AMBULATORY_CARE_PROVIDER_SITE_OTHER): Payer: BC Managed Care – PPO

## 2022-09-20 DIAGNOSIS — J309 Allergic rhinitis, unspecified: Secondary | ICD-10-CM

## 2022-09-20 NOTE — Nursing Note (Signed)
09/20/22 1300   Vial A   Set 2   Skin test/Wheal Size .55ml,8mm   Injection Laterality Right   Injection Site Upper Arm   Dose  0.4   Time Observed 30 Minutes   Injection Site Reaction Negative   Vial B   Set 1   Skin test/Wheal Size .27ml,9mm   Injection Laterality Left   Injection Site Upper Arm   Dose  0.4   Time Observed 30 Minutes   Injection Site Reaction Negative     April Manson Allan, LPN  I have reviewed the above allergy vial safety test, give injection.  Emiliano Dyer, FNP-BC

## 2022-11-22 ENCOUNTER — Other Ambulatory Visit: Payer: Self-pay

## 2022-11-22 ENCOUNTER — Encounter (INDEPENDENT_AMBULATORY_CARE_PROVIDER_SITE_OTHER): Payer: Self-pay | Admitting: OTOLARYNGOLOGY

## 2022-11-22 ENCOUNTER — Ambulatory Visit (INDEPENDENT_AMBULATORY_CARE_PROVIDER_SITE_OTHER): Payer: BC Managed Care – PPO | Admitting: OTOLARYNGOLOGY

## 2022-11-22 VITALS — Ht 66.0 in | Wt 165.0 lb

## 2022-11-22 DIAGNOSIS — J339 Nasal polyp, unspecified: Secondary | ICD-10-CM

## 2022-11-22 DIAGNOSIS — G4733 Obstructive sleep apnea (adult) (pediatric): Secondary | ICD-10-CM

## 2022-11-22 DIAGNOSIS — J309 Allergic rhinitis, unspecified: Secondary | ICD-10-CM

## 2022-11-22 NOTE — H&P (Addendum)
ENT, [place]  [place] [address]  Phone: [phone]  Fax: [phone]      Encounter Date: 11/22/2022    Patient ID: [name]  MRN: [id number]    DOB: [birth year]  Age: 61 y.o. male     Progress Note       Referring Provider:  Pcp, No    Reason for Visit:   Chief Complaint   Patient presents with    Allergies     Here for 1 year follow up on allergies and CPAP. Continues using allergy injections and CPAP at home        History of Present Illness:  Clarence Nguyen is a 62 y.o. male who is FU on AR. Dr. Andres Shad is tolerating allergy shots well without adverse reactions. Sx's aren't too bad in the winter, usually worse in the spring. Using fluticasone. Has not needed Pepcid.  Tolerating CPAP      Patient History:  There is no problem list on file for this patient.    Current Outpatient Medications   Medication Sig    atorvastatin (LIPITOR) 10 mg Oral Tablet     famotidine (PEPCID) 40 mg Oral Tablet     fluticasone propionate (XHANCE) 93 mcg/actuation Nasal Aerosol Breath Activated Spray 1 spray twice a day by intranasal route.    glatiramer (COPAXONE) 40 mg/mL Subcutaneous Syringe Inject 1 mL (40 mg total) under the skin      No Known Allergies  Past Medical History:   Diagnosis Date    Diverticulosis of colon      Past Surgical History:   Procedure Laterality Date    HX COLON SURGERY (ANY)      HX HEMORRHOIDECTOMY      HX TONSILLECTOMY       Family Medical History:    None         Social History     Tobacco Use    Smoking status: Never    Smokeless tobacco: Never   Vaping Use    Vaping status: Never Used   Substance Use Topics    Alcohol use: Not Currently    Drug use: Not Currently       Review of Systems:  Review of Systems    Physical Exam:  Ht 1.676 m ('5\' 6"'$ )   Wt 74.8 kg (165 lb)   BMI 26.63 kg/m       Physical Exam  Constitutional:       Appearance: Normal appearance. He is well-developed, well-groomed and normal weight.   HENT:      Head: Normocephalic and  atraumatic.      Right Ear: Hearing, tympanic membrane, ear canal and external ear normal.      Left Ear: Hearing, tympanic membrane, ear canal and external ear normal.      Nose: Septal deviation and mucosal edema present.      Right Turbinates: Enlarged.      Left Turbinates: Enlarged.      Mouth/Throat:      Lips: Pink.      Mouth: Mucous membranes are moist.      Pharynx: Oropharynx is clear. Uvula midline.   Eyes:      Extraocular Movements: Extraocular movements intact.   Neck:      Trachea: Phonation normal.   Pulmonary:      Effort: Pulmonary effort is normal.   Musculoskeletal:      Cervical back: Normal range of motion and neck supple.   Lymphadenopathy:  Cervical: No cervical adenopathy.   Skin:     General: Skin is warm.   Neurological:      Mental Status: He is alert and oriented to person, place, and time.      Cranial Nerves: Cranial nerves 2-12 are intact. No facial asymmetry.   Psychiatric:         Attention and Perception: Attention normal.         Mood and Affect: Mood normal.         Speech: Speech normal.         Behavior: Behavior normal. Behavior is cooperative.         Assessment:  ENCOUNTER DIAGNOSES     ICD-10-CM   1. Chronic allergic rhinitis  J30.9   2. 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 OSA on CPAP  G47.33       Plan:  Medical records reviewed on 11/22/2022.  Cont immunotherapy and fluticasone. Cont CPAP.  Order supplies PRN  Nasal saline b.i.d.  No orders of the defined types were placed in this encounter.    Follow-up in 1 year or sooner PRN    The advanced practice clinician's documentation was reviewed/amended in its entirety with the assessment and plan portion completely performed independently by me during this encounter.    Gillermo Murdoch, DO      Mathis Dad, PA-C  11/22/2022, 15:43

## 2023-02-12 ENCOUNTER — Other Ambulatory Visit (INDEPENDENT_AMBULATORY_CARE_PROVIDER_SITE_OTHER): Payer: BC Managed Care – PPO | Admitting: OTOLARYNGOLOGY

## 2023-02-12 DIAGNOSIS — J301 Allergic rhinitis due to pollen: Secondary | ICD-10-CM

## 2023-02-12 DIAGNOSIS — J3089 Other allergic rhinitis: Secondary | ICD-10-CM

## 2023-02-12 NOTE — Nursing Note (Signed)
02/12/23 1000   ENT Immunotherapy Vial Preparation Flowsheet   Allergy Test Date 10/09/18   Managing Physician Catrena Vari   Initials rs   A   Preparation Date 02/12/23   Expiration Date 05/24/23   Immunotherapy Status No escalation   Diagnosis Allergic Rhinitis due to pollen (J30.1)   [place] Grass   Initial Endpoint - [place] Grass 5   Dilution of Antigen 1   Volume of Antigen 0.75ml   Timothy   Initial Endpoint - Timothy 6   Dilution of Antigen 2   Volume of Antigen 0.19ml   Bahia   Initial Endpoint - Bahia 5   Dilution of Antigen 1   Volume of Antigen 0.42ml   Ragweed   Initial Endpoint - Ragweed 6   Dilution of Antigen 2   Volume of Antigen 0.62ml   Mugwort   Initial Endpoint - Mugwort 5   Dilution of Antigen 1   Volume of Antigen 0.29ml   A   Concentrates A 1   10% Glycerine Diluent A 4.5   Total Volume A 5.5   B   Preparation Date 02/12/23   Expiration Date 05/24/23   Immunotherapy Status No escalation   Diagnosis 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);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);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)   Candida   Initial Endpoint - Candida 4   Dilution of Antigen 1   Volume of Antigen 0.23ml   Epidermophyton   Initial Endpoint - Epidermophyton 4   Dilution of Antigen 1   Volume of Antigen 0.28ml   Bipolaris Sorokiniana   Initial Endpoint - Bipolaris [place] 3   Dilution of Antigen Concentrate   Volume of Antigen 0.105ml   Penicillium   Initial Endpoint - Penicillium 2   Dilution of Antigen Concentrate   Volume of Antigen 0.55ml   Corn Smut   Initial Endpoint - Corn Smut 2   Dilution of Antigen Concentrate   Volume of Antigen 0.41ml   Cat   Initial Endpoint - Cat 2   Dilution of Antigen Concentrate   Volume of Antigen 0.31ml   D. Farinae Dust Mite   Initial Endpoint - D. Farinae Dust Mite 4   Dilution of Antigen Concentrate   Volume of Antigen 0.30ml   D. Pteronyssinus   Initial Endpoint - D. Pteronyssinus 4   Dilution of Antigen Concentrate   Volume of Antigen 0.15ml   B    Concentrates B 1.6   Saline Diluent B 3.9   Total Volume B 5.5     Jasmine Awe, LPN  I have reviewed the above allergy test results which were positive, and will pursue treatment accordingly.  Lonia Farber, DO

## 2023-02-21 ENCOUNTER — Ambulatory Visit (INDEPENDENT_AMBULATORY_CARE_PROVIDER_SITE_OTHER): Payer: BC Managed Care – PPO

## 2023-02-21 ENCOUNTER — Other Ambulatory Visit: Payer: Self-pay

## 2023-02-21 DIAGNOSIS — J309 Allergic rhinitis, unspecified: Secondary | ICD-10-CM

## 2023-02-21 NOTE — Nursing Note (Signed)
02/21/23 1300   Vial A   Set 2   Skin test/Wheal Size 0.67ml/ 9mm   Injection Laterality Right   Injection Site Upper Arm   Dose  0.05   Time Observed 30 Minutes   Injection Site Reaction Negative   Vial B   Set 1   Skin test/Wheal Size 0.8ml/ 8mm   Injection Laterality Left   Injection Site Upper Arm   Dose  0.05   Time Observed 30 Minutes   Injection Site Reaction Negative     Merlinda Frederick, LPN

## 2023-02-21 NOTE — Nursing Note (Signed)
02/21/23 1300   Vial A   Set 2   Skin test/Wheal Size 0.53ml/ 9mm   Injection Laterality Right   Injection Site Upper Arm   Dose  0.05   Time Observed 30 Minutes   Injection Site Reaction Negative   Vial B   Set 1   Skin test/Wheal Size 0.60ml/ 8mm   Injection Laterality Left   Injection Site Upper Arm   Dose  0.05   Time Observed 30 Minutes   Injection Site Reaction Negative     Merlinda Frederick, LPN  I have reviewed the above allergy vial safety test, give injection.  Elnora Morrison, FNP-BC

## 2023-04-25 IMAGING — MR MRI BRAIN W/O CONTRAST
9 of 10 series · 37 of 48 positions shown · non-contrast
Comparison: MRI dated 04/07/2022.

﻿EXAM:  MRI BRAIN W/O CONTRAST
INDICATION: History of multiple sclerosis.  Follow-up.
TECHNIQUE: Multiplanar, multisequential MRI of the brain was performed without administration of contrast.

[Series 5: DWI · axial · 5.0mm · 1.35mm/px · z∈[-67,+59]mm · 8 of 88 slices shown (1 of 3)]
[im 1/88]
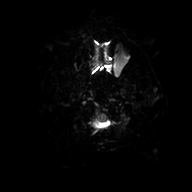
[im 14/88]
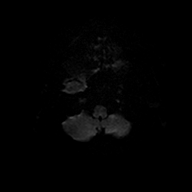
[im 27/88]
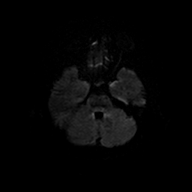
[im 41/88]
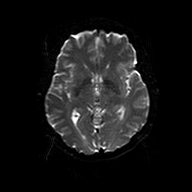
[im 47/88]
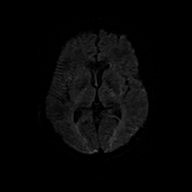
[im 61/88]
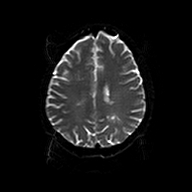
[im 74/88]
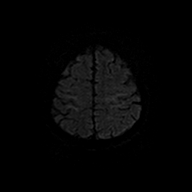
[im 88/88]
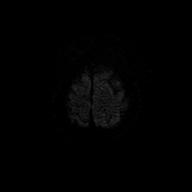

[Series 6: DWI · axial · 5.0mm · 1.35mm/px · z∈[-67,+59]mm · 4 of 22 slices shown (2 of 3)]
[im 1/22]
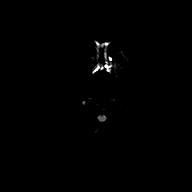
[im 8/22]
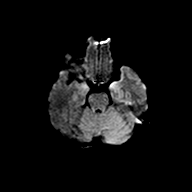
[im 15/22]
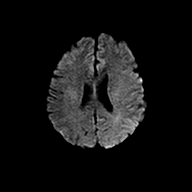
[im 22/22]
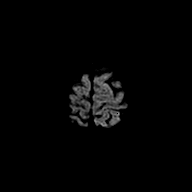

[Series 7: DWI · axial · 5.0mm · 1.35mm/px · z∈[-67,+59]mm · 3 of 22 slices shown (3 of 3)]
[im 1/22]
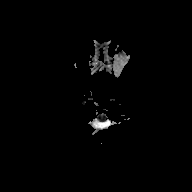
[im 11/22]
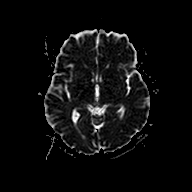
[im 22/22]
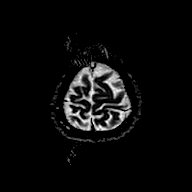

[Series 8: FLAIR · sagittal · 5.0mm · 0.75mm/px · 4 of 28 slices shown (1 of 2)]
[im 1/28]
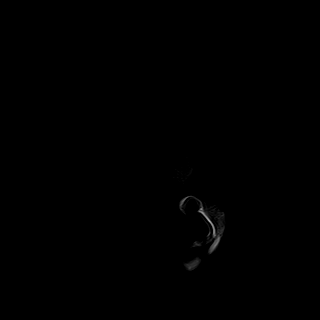
[im 10/28]
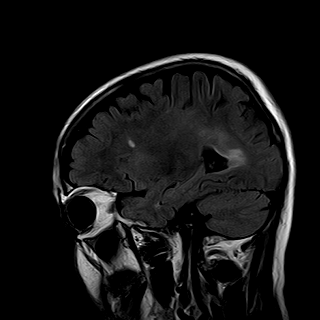
[im 19/28]
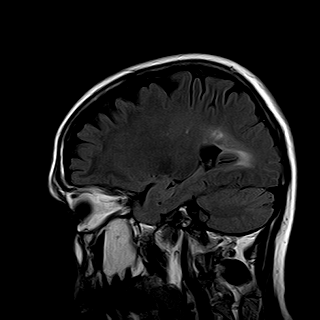
[im 28/28]
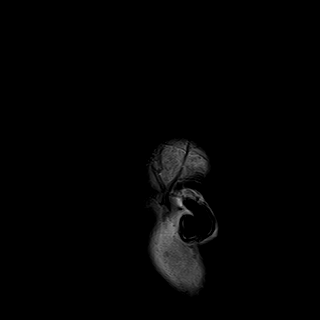

[Series 9: PD · axial · 4.0mm · 0.43mm/px · z∈[-79,+75]mm · 5 of 36 slices shown (1 of 2)]
[im 1/36]
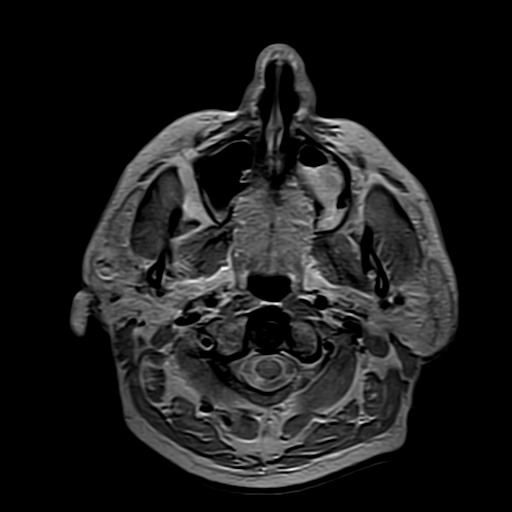
[im 9/36]
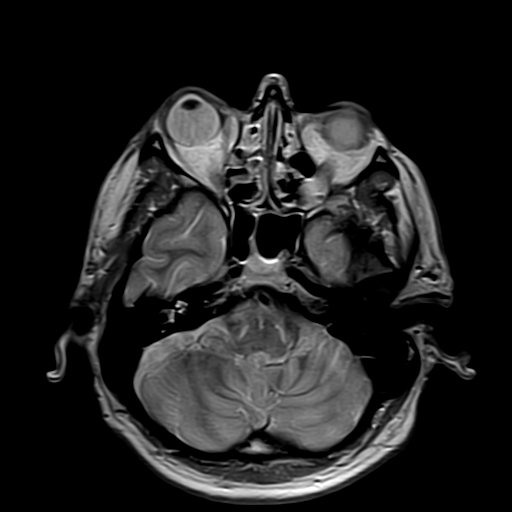
[im 18/36]
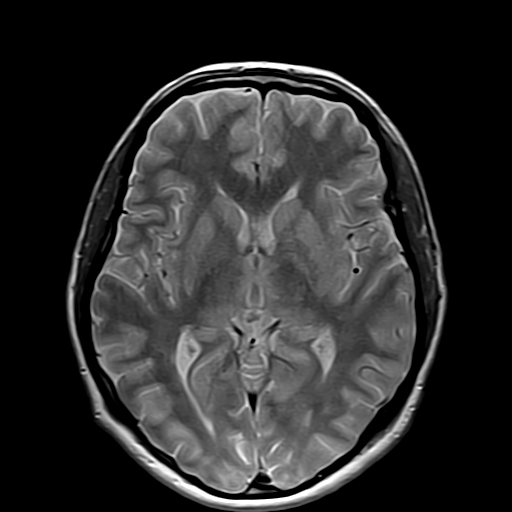
[im 27/36]
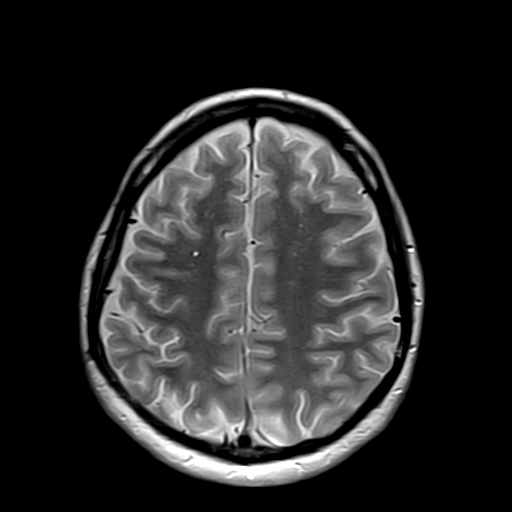
[im 36/36]
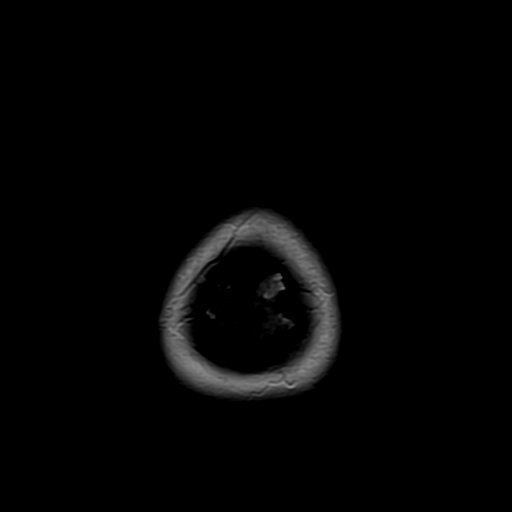

[Series 10: PD · axial · 4.0mm · 0.43mm/px · z∈[-79,-4]mm · 3 of 36 slices shown (2 of 2)]
[im 1/36]
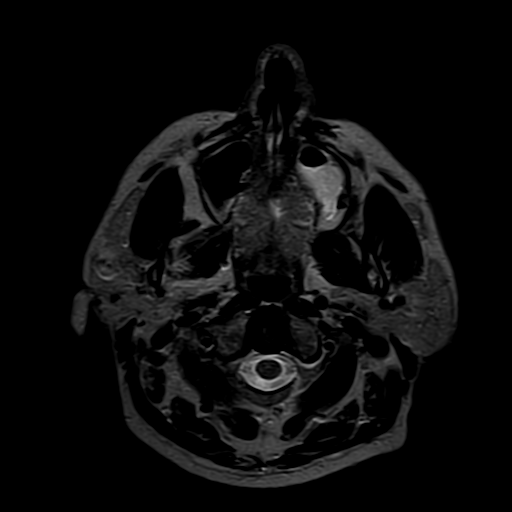
[im 9/36]
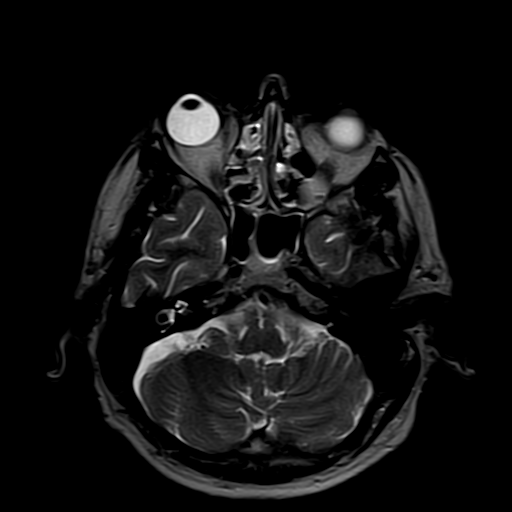
[im 18/36]
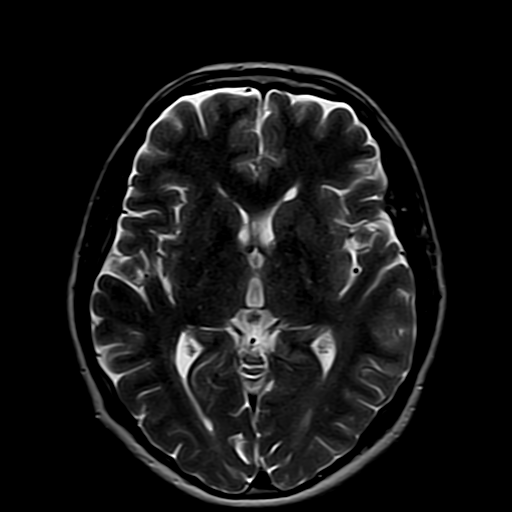

[Series 11: FLAIR · axial · 5.0mm · 0.76mm/px · z∈[-65,+61]mm · 3 of 22 slices shown (2 of 2)]
[im 1/22]
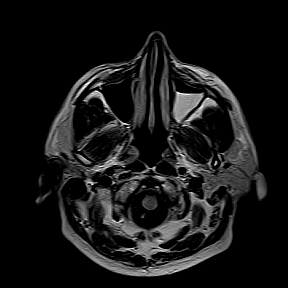
[im 11/22]
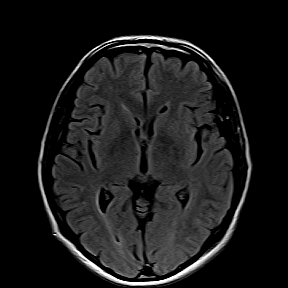
[im 22/22]
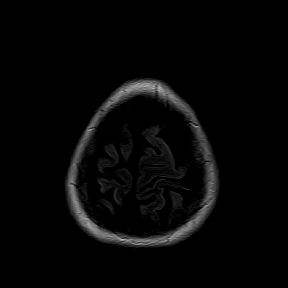

[Series 12: T1 · axial · 5.0mm · 0.69mm/px · z∈[-65,+61]mm · 3 of 22 slices shown]
[im 1/22]
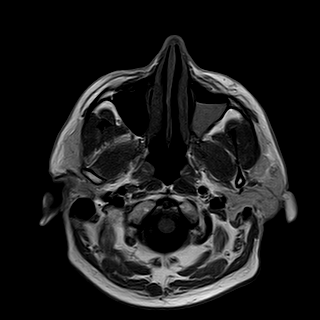
[im 11/22]
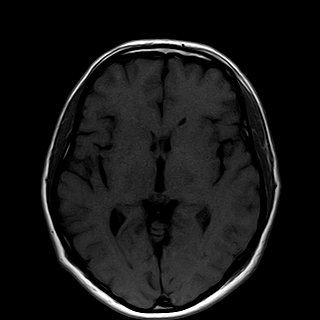
[im 22/22]
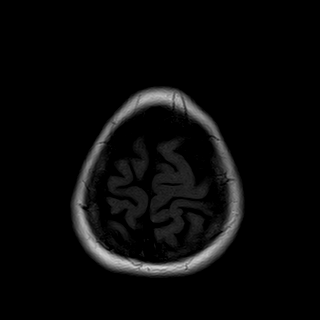

[Series 14: T2 · coronal · 5.0mm · 0.43mm/px · 4 of 28 slices shown]
[im 1/28]
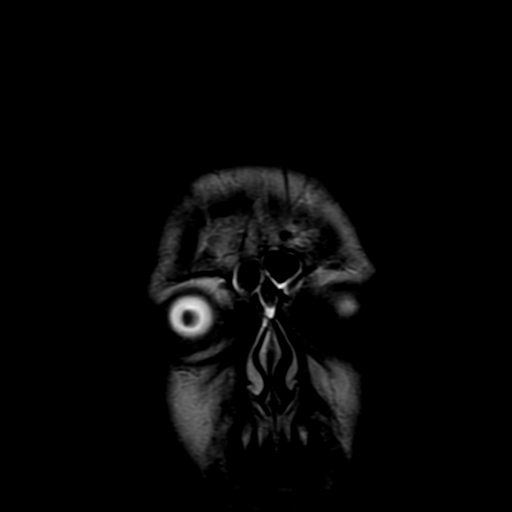
[im 10/28]
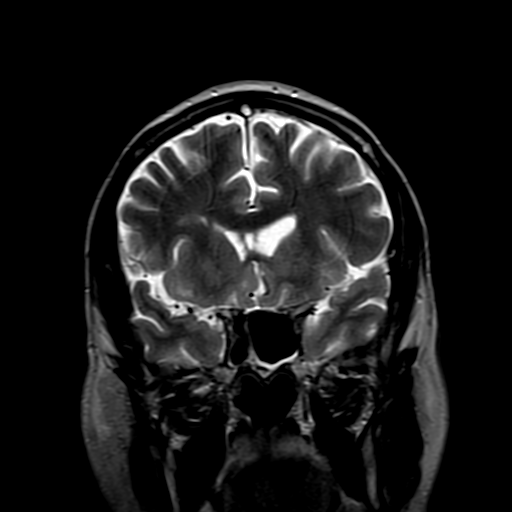
[im 19/28]
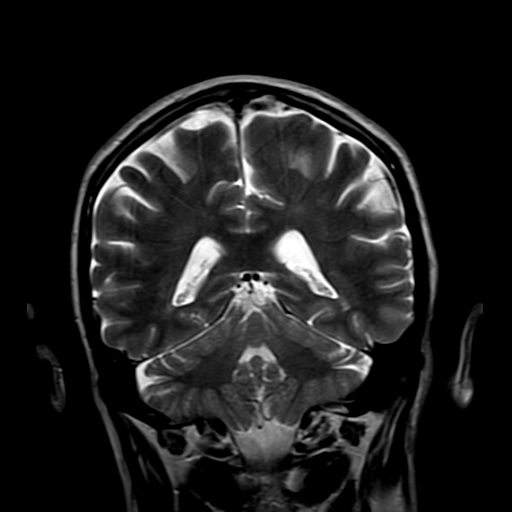
[im 28/28]
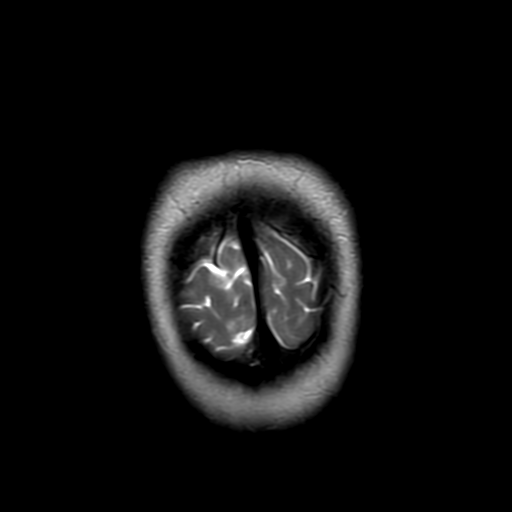

[37 of 48 positions shown; findings below may reference images not displayed]

FINDINGS: No focal restricted diffusion on diffusion sequence.  No intracranial bleed or extra-axial collections are seen.  No ventriculomegaly or midline shift.  Mild symmetric global cerebral cortical atrophy. 

Sagittal and axial FLAIR images show approximately 6 FLAIR bright periventricular subcortical lesions on the left side and approximately 4 on the right side.  Minimal FLAIR signal increase in the inferior aspect of the corpus callosum.  No FLAIR signal lesions are seen in the brainstem and cerebellum.

Acute sinusitis with fluid level is noted in the left maxillary sinus.  Major arteries of circle of Willis are patent.
IMPRESSION: 1. Bilateral periventricular, subcortical FLAIR bright lesions are stable in extent and numbers compared with prior imaging studies.

2. No abnormal FLAIR signal lesions of the posterior fossa including cerebellum and brainstem. No focal areas of restricted diffusion.

3. Stable mild symmetric cerebral cortical atrophy.

4. Acute sinusitis with fluid level in the left maxillary sinus. Bilateral ethmoid sinusitis.

## 2023-11-28 ENCOUNTER — Encounter (INDEPENDENT_AMBULATORY_CARE_PROVIDER_SITE_OTHER): Payer: Self-pay | Admitting: OTOLARYNGOLOGY

## 2023-11-28 ENCOUNTER — Other Ambulatory Visit: Payer: Self-pay

## 2023-11-28 ENCOUNTER — Ambulatory Visit: Payer: PRIVATE HEALTH INSURANCE | Attending: OTOLARYNGOLOGY | Admitting: OTOLARYNGOLOGY

## 2023-11-28 VITALS — Ht 66.0 in | Wt 168.0 lb

## 2023-11-28 DIAGNOSIS — J329 Chronic sinusitis, unspecified: Secondary | ICD-10-CM | POA: Insufficient documentation

## 2023-11-28 DIAGNOSIS — J339 Nasal polyp, unspecified: Secondary | ICD-10-CM | POA: Insufficient documentation

## 2023-11-28 DIAGNOSIS — J309 Allergic rhinitis, unspecified: Secondary | ICD-10-CM | POA: Insufficient documentation

## 2023-11-28 DIAGNOSIS — G4733 Obstructive sleep apnea (adult) (pediatric): Secondary | ICD-10-CM | POA: Insufficient documentation

## 2023-11-28 NOTE — H&P (Signed)
 ENT, [facility]  [address]  [place] [address]  Operated by [facility]      Name: Clarence Nguyen MRN:  [id number]   Date: 11/28/2023 DOB: [birth year] (62 y.o.)       Referring Provider:  Pcp, No    Reason for Visit:   Chief Complaint   Patient presents with    Allergic Rhinitis     1 year rc on allergies.        History of Present Illness:  Clarence Nguyen is a 63 y.o. male who is FU on AR/CRS with NP;  He was on allergy shots but was forgetting to take them weekly, so he discontinued. His allergies are not bad in the winter months. He c/o L>R nasal congestion. No recent or frequent sinus infections.       Patient History:  There is no problem list on file for this patient.    Current Outpatient Medications   Medication Sig    atorvastatin (LIPITOR) 10 mg Oral Tablet     fluticasone propionate (FLONASE) 50 mcg/actuation Nasal Spray, Suspension Administer 1 Spray into each nostril Once a day    glatiramer (COPAXONE) 40 mg/mL Subcutaneous Syringe Inject 1 mL (40 mg total) under the skin      No Known Allergies  Past Medical History:   Diagnosis Date    Diverticulosis of colon      Past Surgical History:   Procedure Laterality Date    HX COLON SURGERY (ANY)      HX HEMORRHOIDECTOMY      HX TONSILLECTOMY       Family Medical History:    None         Social History     Tobacco Use    Smoking status: Never    Smokeless tobacco: Never   Vaping Use    Vaping status: Never Used   Substance Use Topics    Alcohol use: Not Currently    Drug use: Not Currently       Review of Systems:  Review of Systems    Physical Exam:  Ht 1.676 m (5\' 6" )   Wt 76.2 kg (168 lb)   BMI 27.12 kg/m       Physical Exam  Constitutional:       Appearance: Normal appearance. He is well-developed, well-groomed and normal weight.   HENT:      Head: Normocephalic and atraumatic.      Right Ear: Hearing, tympanic membrane, ear canal and external ear normal.      Left Ear: Hearing, tympanic membrane, ear canal  and external ear normal.      Nose: Septal deviation and mucosal edema present.      Right Turbinates: Enlarged.      Left Turbinates: Enlarged.      Mouth/Throat:      Lips: Pink.      Mouth: Mucous membranes are moist.      Pharynx: Oropharynx is clear. Uvula midline.   Eyes:      Extraocular Movements: Extraocular movements intact.   Neck:      Trachea: Phonation normal.   Pulmonary:      Effort: Pulmonary effort is normal.   Musculoskeletal:      Cervical back: Normal range of motion and neck supple.   Lymphadenopathy:      Cervical: No cervical adenopathy.   Skin:     General: Skin is warm.   Neurological:      Mental Status:  He is alert and oriented to person, place, and time.      Cranial Nerves: Cranial nerves 2-12 are intact. No facial asymmetry.   Psychiatric:         Attention and Perception: Attention normal.         Mood and Affect: Mood normal.         Speech: Speech normal.         Behavior: Behavior normal. Behavior is cooperative.          Assessment:  ENCOUNTER DIAGNOSES     ICD-10-CM   1. Chronic allergic rhinitis  J30.9   2. 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  Discussed Nucala/Dupixent option for nasal polyps. Discontinued immunotherapy.   Continue Flonase  Nasal saline b.i.d.  Orders Placed This Encounter    [phone] - NASAL ENDOSCOPY DIAGNOSTIC UNILATERAL OR BILATERAL (AMB ONLY)     Follow-up in 1 year sooner PRN      The advanced practice clinician's documentation was reviewed/amended in its entirety with the assessment and plan portion completely performed independently by me during this encounter.   Lonia Farber, D.O., MMS  ENT / Facial Plastic Surgery      Marcelline Deist, PA-C    I appreciate the opportunity to be involved in the care of your patients.  If you have any questions or concerns regarding this encounter, please do not hesitate to contact me at your convenience.         This note may have been partially generated using MModal Fluency Direct system, and there may be some incorrect words, spellings, and punctuation that were not noted in checking the note before saving, though effort was made to avoid such errors.

## 2023-11-29 ENCOUNTER — Encounter (INDEPENDENT_AMBULATORY_CARE_PROVIDER_SITE_OTHER): Payer: Self-pay | Admitting: OTOLARYNGOLOGY

## 2023-11-29 NOTE — Procedures (Signed)
 ENT, [facility]  [address]  [place] [address]  Operated by [facility]  Procedure Note    Name: Clarence Nguyen MRN:  [id number]   Date: 11/28/2023 DOB:  06-11-1961 (62 y.o.)         31231 - NASAL ENDOSCOPY DIAGNOSTIC UNILATERAL OR BILATERAL (AMB ONLY)    Performed by: Lonia Farber, DO  Authorized by: Lonia Farber, DO    Time Out:     Immediately before the procedure, a time out was called:  Yes    Patient verified:  Yes    Procedure Verified:  Yes    Site Verified:  Yes  Indications for procedure: Monitor chronic disease    Anesthesia:  Topical lidocaine/phenylephrine    Description: Nasal endoscopy with a flexible rhinolaryngoscope was performed with examination of the  septum, inferior, middle, and superior meatus, turbinates, sphenoethmoidal recess, and nasopharynx. ET orifices and nasopharynx were normal.     Findings: Septal deviation, Inferior turbinate hypertrophy, Allergic rhinitis, and polypoid changes middle meatus b/l    The patient tolerated the procedure well.      This note may have been partially generated using MModal Fluency Direct system, and there may be some incorrect words, spellings, and punctuation that were not noted in checking the note before saving, though effort was made to avoid such errors.      Lonia Farber, DO

## 2024-12-02 ENCOUNTER — Ambulatory Visit (INDEPENDENT_AMBULATORY_CARE_PROVIDER_SITE_OTHER): Payer: Self-pay | Admitting: OTOLARYNGOLOGY
